# Patient Record
Sex: Female | Born: 1985 | Race: White | Hispanic: No | Marital: Single | Smoking: Current every day smoker
Health system: Southern US, Community
[De-identification: ages and names within clinical notes are randomized; demographics above are authoritative.]

## PROBLEM LIST (undated history)

## (undated) DIAGNOSIS — F319 Bipolar disorder, unspecified: Secondary | ICD-10-CM

## (undated) DIAGNOSIS — N809 Endometriosis, unspecified: Secondary | ICD-10-CM

## (undated) DIAGNOSIS — F410 Panic disorder [episodic paroxysmal anxiety] without agoraphobia: Secondary | ICD-10-CM

## (undated) DIAGNOSIS — F909 Attention-deficit hyperactivity disorder, unspecified type: Secondary | ICD-10-CM

## (undated) HISTORY — PX: LAPAROSCOPY: SHX197

---

## 2010-08-28 ENCOUNTER — Emergency Department (HOSPITAL_COMMUNITY): Admission: EM | Admit: 2010-08-28 | Discharge: 2010-08-28 | Payer: Self-pay | Admitting: Family Medicine

## 2011-01-15 LAB — POCT URINALYSIS DIPSTICK
Bilirubin Urine: NEGATIVE
Glucose, UA: NEGATIVE mg/dL
Ketones, ur: NEGATIVE mg/dL
Nitrite: NEGATIVE
Protein, ur: NEGATIVE mg/dL
Specific Gravity, Urine: 1.015 (ref 1.005–1.030)
Urobilinogen, UA: 0.2 mg/dL (ref 0.0–1.0)
pH: 5 (ref 5.0–8.0)

## 2011-01-15 LAB — POCT PREGNANCY, URINE: Preg Test, Ur: NEGATIVE

## 2014-03-17 ENCOUNTER — Encounter (HOSPITAL_COMMUNITY): Payer: Self-pay | Admitting: Emergency Medicine

## 2014-03-17 ENCOUNTER — Emergency Department (HOSPITAL_COMMUNITY)
Admission: EM | Admit: 2014-03-17 | Discharge: 2014-03-17 | Disposition: A | Discharge status: Home / Self Care | Payer: BC Managed Care – PPO | Source: Home / Self Care | Attending: Family Medicine | Admitting: Family Medicine

## 2014-03-17 DIAGNOSIS — N390 Urinary tract infection, site not specified: Secondary | ICD-10-CM

## 2014-03-17 HISTORY — DX: Bipolar disorder, unspecified: F31.9

## 2014-03-17 HISTORY — DX: Endometriosis, unspecified: N80.9

## 2014-03-17 HISTORY — DX: Attention-deficit hyperactivity disorder, unspecified type: F90.9

## 2014-03-17 HISTORY — DX: Panic disorder (episodic paroxysmal anxiety): F41.0

## 2014-03-17 LAB — POCT URINALYSIS DIP (DEVICE)
Bilirubin Urine: NEGATIVE
Glucose, UA: NEGATIVE mg/dL
Ketones, ur: NEGATIVE mg/dL
Nitrite: NEGATIVE
Protein, ur: NEGATIVE mg/dL
Specific Gravity, Urine: <=1.005 (ref 1.005–1.030)
Urobilinogen, UA: 0.2 mg/dL (ref 0.0–1.0)
pH: 5.5 (ref 5.0–8.0)

## 2014-03-17 LAB — POCT PREGNANCY, URINE: Preg Test, Ur: NEGATIVE

## 2014-03-17 MED ORDER — NITROFURANTOIN MONOHYD MACRO 100 MG PO CAPS: 100.0000 mg | ORAL_CAPSULE | Freq: Two times a day (BID) | ORAL | Status: AC

## 2014-03-17 NOTE — ED Notes (Signed)
"  possible bladder infection"

## 2014-03-17 NOTE — ED Provider Notes (Signed)
CSN: [id number]     Arrival date & time 03/17/14  1157 History   First MD Initiated Contact with Patient 03/17/14 1223     Chief Complaint  Patient presents with  . Urinary Tract Infection   (Consider location/radiation/quality/duration/timing/severity/associated sxs/prior Treatment) Patient is a 28 y.o. female presenting with urinary tract infection. The history is provided by the patient.  Urinary Tract Infection This is a new problem. The current episode started yesterday. The problem has been gradually worsening. Associated symptoms include abdominal pain.    Past Medical History  Diagnosis Date  . Endometriosis   . ADHD (attention deficit hyperactivity disorder)   . Panic disorder   . Bipolar 1 disorder    Past Surgical History  Procedure Laterality Date  . Laparoscopy     No family history on file. History  Substance Use Topics  . Smoking status: Current Every Day Smoker  . Smokeless tobacco: Not on file  . Alcohol Use: No   OB History   Grav Para Term Preterm Abortions TAB SAB Ect Mult Living                 Review of Systems  Constitutional: Negative.   Gastrointestinal: Positive for abdominal pain. Negative for nausea and vomiting.  Genitourinary: Positive for dysuria, urgency and frequency. Negative for vaginal bleeding and vaginal discharge.    Allergies  Hydrocodone and Percocet  Home Medications   Prior to Admission medications   Medication Sig Start Date End Date Taking? Authorizing Provider  ALPRAZolam Prudy Feeler(XANAX) 1 MG tablet Take 1 mg by mouth at bedtime as needed for anxiety.   Yes Historical Provider, MD  amphetamine-dextroamphetamine (ADDERALL) 30 MG tablet Take 30 mg by mouth daily.   Yes Historical Provider, MD  lamoTRIgine (LAMICTAL) 100 MG tablet Take 100 mg by mouth daily.   Yes Historical Provider, MD   BP 116/65  Pulse 93  Temp(Src) 98.6 F (37 C) (Oral)  Resp 20  SpO2 100% Physical Exam  Nursing note and vitals  reviewed. Constitutional: She is oriented to person, place, and time. She appears well-developed and well-nourished.  Abdominal: Soft. Normal appearance and bowel sounds are normal. She exhibits no distension and no mass. There is tenderness in the suprapubic area. There is no rigidity, no rebound, no guarding and no CVA tenderness.  Neurological: She is alert and oriented to person, place, and time.  Skin: Skin is warm and dry.    ED Course  Procedures (including critical care time) Labs Review Labs Reviewed  POCT URINALYSIS DIP (DEVICE) - Abnormal; Notable for the following:    Hgb urine dipstick LARGE (*)    Leukocytes, UA MODERATE (*)    All other components within normal limits  POCT PREGNANCY, URINE    Imaging Review No results found.   MDM   1. UTI (lower urinary tract infection)        Linna HoffJames D Kindl, MD 03/17/14 1250

## 2014-03-17 NOTE — Discharge Instructions (Signed)
Take all of medicine as directed, drink lots of fluids, see your doctor if further problems. °

## 2015-11-30 ENCOUNTER — Ambulatory Visit (INDEPENDENT_AMBULATORY_CARE_PROVIDER_SITE_OTHER): Payer: BLUE CROSS/BLUE SHIELD | Admitting: Psychiatry

## 2015-11-30 DIAGNOSIS — F4010 Social phobia, unspecified: Secondary | ICD-10-CM

## 2015-11-30 DIAGNOSIS — F332 Major depressive disorder, recurrent severe without psychotic features: Secondary | ICD-10-CM | POA: Diagnosis not present
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
## 2015-12-07 ENCOUNTER — Telehealth (HOSPITAL_COMMUNITY): Payer: Self-pay | Admitting: *Deleted

## 2015-12-07 ENCOUNTER — Institutional Professional Consult (permissible substitution): Payer: Self-pay | Admitting: Psychiatry

## 2015-12-07 NOTE — Telephone Encounter (Signed)
Called  [phone] spoke to [facility] for prior authorization of ECT treatment. Was told no authorization is required.

## 2016-01-09 ENCOUNTER — Emergency Department (HOSPITAL_COMMUNITY): Payer: BLUE CROSS/BLUE SHIELD

## 2016-01-09 ENCOUNTER — Emergency Department (HOSPITAL_COMMUNITY)
Admission: EM | Admit: 2016-01-09 | Discharge: 2016-01-09 | Disposition: A | Discharge status: Home / Self Care | Payer: BLUE CROSS/BLUE SHIELD | Attending: Emergency Medicine | Admitting: Emergency Medicine

## 2016-01-09 ENCOUNTER — Encounter (HOSPITAL_COMMUNITY): Payer: Self-pay | Admitting: *Deleted

## 2016-01-09 DIAGNOSIS — Z79899 Other long term (current) drug therapy: Secondary | ICD-10-CM | POA: Diagnosis not present

## 2016-01-09 DIAGNOSIS — F909 Attention-deficit hyperactivity disorder, unspecified type: Secondary | ICD-10-CM | POA: Diagnosis not present

## 2016-01-09 DIAGNOSIS — F17200 Nicotine dependence, unspecified, uncomplicated: Secondary | ICD-10-CM | POA: Diagnosis not present

## 2016-01-09 DIAGNOSIS — R479 Unspecified speech disturbances: Secondary | ICD-10-CM | POA: Diagnosis not present

## 2016-01-09 DIAGNOSIS — F319 Bipolar disorder, unspecified: Secondary | ICD-10-CM | POA: Insufficient documentation

## 2016-01-09 DIAGNOSIS — R42 Dizziness and giddiness: Secondary | ICD-10-CM | POA: Insufficient documentation

## 2016-01-09 DIAGNOSIS — F419 Anxiety disorder, unspecified: Secondary | ICD-10-CM | POA: Diagnosis not present

## 2016-01-09 DIAGNOSIS — Z8742 Personal history of other diseases of the female genital tract: Secondary | ICD-10-CM | POA: Diagnosis not present

## 2016-01-09 LAB — RAPID URINE DRUG SCREEN, HOSP PERFORMED
Amphetamines: NOT DETECTED
Barbiturates: NOT DETECTED
Benzodiazepines: POSITIVE — AB
Cocaine: NOT DETECTED
Opiates: NOT DETECTED
Tetrahydrocannabinol: NOT DETECTED

## 2016-01-09 LAB — ETHANOL: Alcohol, Ethyl (B): <5 mg/dL (ref <5)

## 2016-01-09 LAB — CBC
HCT: 38.8 % (ref 36.0–46.0)
Hemoglobin: 13.6 g/dL (ref 12.0–15.0)
MCH: 30.5 pg (ref 26.0–34.0)
MCHC: 35.1 g/dL (ref 30.0–36.0)
MCV: 87 fL (ref 78.0–100.0)
Platelets: 292 10*3/uL (ref 150–400)
RBC: 4.46 MIL/uL (ref 3.87–5.11)
RDW: 12.2 % (ref 11.5–15.5)
WBC: 11.4 10*3/uL — ABNORMAL HIGH (ref 4.0–10.5)

## 2016-01-09 LAB — URINALYSIS, ROUTINE W REFLEX MICROSCOPIC
Bilirubin Urine: NEGATIVE
Glucose, UA: NEGATIVE mg/dL
Ketones, ur: NEGATIVE mg/dL
Nitrite: NEGATIVE
Protein, ur: NEGATIVE mg/dL
Specific Gravity, Urine: 1.009 (ref 1.005–1.030)
pH: 7.5 (ref 5.0–8.0)

## 2016-01-09 LAB — BASIC METABOLIC PANEL
Anion gap: 9 (ref 5–15)
BUN: 11 mg/dL (ref 6–20)
CO2: 26 mmol/L (ref 22–32)
Calcium: 9.6 mg/dL (ref 8.9–10.3)
Chloride: 103 mmol/L (ref 101–111)
Creatinine, Ser: 0.87 mg/dL (ref 0.44–1.00)
GFR calc Af Amer: >60 mL/min (ref >60)
GFR calc non Af Amer: >60 mL/min (ref >60)
Glucose, Bld: 94 mg/dL (ref 65–99)
Potassium: 4.5 mmol/L (ref 3.5–5.1)
Sodium: 138 mmol/L (ref 135–145)

## 2016-01-09 LAB — URINE MICROSCOPIC-ADD ON: Bacteria, UA: NONE SEEN

## 2016-01-09 LAB — CBG MONITORING, ED: Glucose-Capillary: 90 mg/dL (ref 65–99)

## 2016-01-09 MED ORDER — SODIUM CHLORIDE 0.9 % IV BOLUS (SEPSIS): 1000.0000 mL | Freq: Once | INTRAVENOUS | Status: DC

## 2016-01-09 NOTE — ED Notes (Signed)
Pt remains monitored by blood pressure, pulse ox, and 5 lead. Pt is noted to have visitor at bedside.  

## 2016-01-09 NOTE — ED Notes (Signed)
Patient transported to CT 

## 2016-01-09 NOTE — Discharge Instructions (Signed)
Dizziness Dizziness is a common problem. It is a feeling of unsteadiness or light-headedness. You may feel like you are about to faint. Dizziness can lead to injury if you stumble or fall. Anyone can become dizzy, but dizziness is more common in older adults. This condition can be caused by a number of things, including medicines, dehydration, or illness. HOME CARE INSTRUCTIONS Taking these steps may help with your condition: Eating and Drinking  Drink enough fluid to keep your urine clear or pale yellow. This helps to keep you from becoming dehydrated. Try to drink more clear fluids, such as water.  Do not drink alcohol.  Limit your caffeine intake if directed by your health care provider.  Limit your salt intake if directed by your health care provider. Activity  Avoid making quick movements.  Rise slowly from chairs and steady yourself until you feel okay.  In the morning, first sit up on the side of the bed. When you feel okay, stand slowly while you hold onto something until you know that your balance is fine.  Move your legs often if you need to stand in one place for a long time. Tighten and relax your muscles in your legs while you are standing.  Do not drive or operate heavy machinery if you feel dizzy.  Avoid bending down if you feel dizzy. Place items in your home so that they are easy for you to reach without leaning over. Lifestyle  Do not use any tobacco products, including cigarettes, chewing tobacco, or electronic cigarettes. If you need help quitting, ask your health care provider.  Try to reduce your stress level, such as with yoga or meditation. Talk with your health care provider if you need help. General Instructions  Watch your dizziness for any changes.  Take medicines only as directed by your health care provider. Talk with your health care provider if you think that your dizziness is caused by a medicine that you are taking.  Tell a friend or a family  member that you are feeling dizzy. If he or she notices any changes in your behavior, have this person call your health care provider.  Keep all follow-up visits as directed by your health care provider. This is important. SEEK MEDICAL CARE IF:  Your dizziness does not go away.  Your dizziness or light-headedness gets worse.  You feel nauseous.  You have reduced hearing.  You have new symptoms.  You are unsteady on your feet or you feel like the room is spinning. SEEK IMMEDIATE MEDICAL CARE IF:  You vomit or have diarrhea and are unable to eat or drink anything.  You have problems talking, walking, swallowing, or using your arms, hands, or legs.  You feel generally weak.  You are not thinking clearly or you have trouble forming sentences. It may take a friend or family member to notice this.  You have chest pain, abdominal pain, shortness of breath, or sweating.  Your vision changes.  You notice any bleeding.  You have a headache.  You have neck pain or a stiff neck.  You have a fever.   This information is not intended to replace advice given to you by your health care provider. Make sure you discuss any questions you have with your health care provider.   Document Released: 04/15/2001 Document Revised: 03/06/2015 Document Reviewed: 10/16/2014 Elsevier Interactive Patient Education 2016 [employer] Generalized Anxiety Disorder Generalized anxiety disorder (GAD) is a mental disorder. It interferes with life functions, including relationships, work,  work, and school. °GAD is different from normal anxiety, which everyone experiences at some point in their lives in response to specific life events and activities. Normal anxiety actually helps us prepare for and get through these life events and activities. Normal anxiety goes away after the event or activity is over.  °GAD causes anxiety that is not necessarily related to specific events or activities. It also causes excess  anxiety in proportion to specific events or activities. The anxiety associated with GAD is also difficult to control. GAD can vary from mild to severe. People with severe GAD can have intense waves of anxiety with physical symptoms (panic attacks).  °SYMPTOMS °The anxiety and worry associated with GAD are difficult to control. This anxiety and worry are related to many life events and activities and also occur more days than not for 6 months or longer. People with GAD also have three or more of the following symptoms (one or more in children): °· Restlessness.   °· Fatigue. °· Difficulty concentrating.   °· Irritability. °· Muscle tension. °· Difficulty sleeping or unsatisfying sleep. °DIAGNOSIS °GAD is diagnosed through an assessment by your health care provider. Your health care provider will ask you questions about your mood, physical symptoms, and events in your life. Your health care provider may ask you about your medical history and use of alcohol or drugs, including prescription medicines. Your health care provider may also do a physical exam and blood tests. Certain medical conditions and the use of certain substances can cause symptoms similar to those associated with GAD. Your health care provider may refer you to a mental health specialist for further evaluation. °TREATMENT °The following therapies are usually used to treat GAD:  °· Medication. Antidepressant medication usually is prescribed for long-term daily control. Antianxiety medicines may be added in severe cases, especially when panic attacks occur.   °· Talk therapy (psychotherapy). Certain types of talk therapy can be helpful in treating GAD by providing support, education, and guidance. A form of talk therapy called cognitive behavioral therapy can teach you healthy ways to think about and react to daily life events and activities. °· Stress management techniques. These include yoga, meditation, and exercise and can be very helpful when they  are practiced regularly. °A mental health specialist can help determine which treatment is best for you. Some people see improvement with one therapy. However, other people require a combination of therapies. °  °This information is not intended to replace advice given to you by your health care provider. Make sure you discuss any questions you have with your health care provider. °  °Document Released: 02/14/2013 Document Revised: 11/10/2014 Document Reviewed: 02/14/2013 °Elsevier Interactive Patient Education ©2016 Elsevier Inc. ° °

## 2016-01-09 NOTE — ED Provider Notes (Signed)
CSN: [id number]     Arrival date & time 01/09/16  1003 History   First MD Initiated Contact with Patient 01/09/16 1342     Chief Complaint  Patient presents with  . Dizziness    Rachel Grimes is a 30 y.o. female with a history of bipolar disorder, ADHD and panic disorder who presents to the ED with her fiance complaining of feeling lightheaded and having trouble with her short-term memory. Patient reports around 7 AM this morning while at work she felt like she might pass out. She reports having some difficulty with her short-term memory and trouble finding words briefly. She reports that she has been repeating herself. She is holding a notebook full of her symptoms during the interview. She reports she is having trouble ambulating and feels off balance. She reports feeling anxious and that her mouth is dry. She has been eating and drinking normally today. She denies fevers, headache, double vision, syncope, neck pain, neck stiffness, back pain, abdominal pain, nausea, vomiting, diarrhea, chest pain, shortness of breath, coughing, numbness, tingling, weakness, loss of bladder control, loss of bowel control or urinary symptoms. No room spinning dizziness. No changes to her medications recently.   Patient is a 30 y.o. female presenting with dizziness. The history is provided by the patient and a significant other. No language interpreter was used.  Dizziness Associated symptoms: no chest pain, no diarrhea, no headaches, no nausea, no palpitations, no shortness of breath, no vomiting and no weakness     Past Medical History  Diagnosis Date  . Endometriosis   . ADHD (attention deficit hyperactivity disorder)   . Panic disorder   . Bipolar 1 disorder [facility])    Past Surgical History  Procedure Laterality Date  . Laparoscopy     No family history on file. Social History  Substance Use Topics  . Smoking status: Current Every Day Smoker  . Smokeless tobacco: None  . Alcohol Use: No   OB History     No data available     Review of Systems  Constitutional: Negative for fever and chills.  HENT: Negative for congestion, sore throat and trouble swallowing.   Eyes: Negative for pain and visual disturbance.  Respiratory: Negative for cough, shortness of breath and wheezing.   Cardiovascular: Negative for chest pain and palpitations.  Gastrointestinal: Negative for nausea, vomiting, abdominal pain and diarrhea.  Genitourinary: Negative for dysuria, urgency, frequency, hematuria and difficulty urinating.  Musculoskeletal: Negative for back pain and neck pain.  Skin: Negative for rash.  Neurological: Positive for speech difficulty and light-headedness. Negative for dizziness, seizures, syncope, facial asymmetry, weakness, numbness and headaches.      Allergies  Hydrocodone and Percocet  Home Medications   Prior to Admission medications   Medication Sig Start Date End Date Taking? Authorizing Provider  ALPRAZolam Prudy Feeler) 1 MG tablet Take 1 mg by mouth 3 (three) times daily as needed for anxiety.    Yes Historical Provider, MD  amphetamine-dextroamphetamine (ADDERALL) 30 MG tablet Take 30 mg by mouth 2 (two) times daily.    Yes Historical Provider, MD  aspirin-acetaminophen-caffeine (EXCEDRIN MIGRAINE) [phone] MG tablet Take 1 tablet by mouth every 6 (six) hours as needed for headache.   Yes Historical Provider, MD  ibuprofen (ADVIL,MOTRIN) 200 MG tablet Take 200 mg by mouth every 6 (six) hours as needed for moderate pain.   Yes Historical Provider, MD  lamoTRIgine (LAMICTAL) 200 MG tablet Take 400 mg by mouth daily.  01/04/16  Yes Historical Provider, MD  propranolol (INDERAL) 20 MG tablet Take 20 mg by mouth 2 (two) times daily. 10/06/15  Yes Historical Provider, MD  nitrofurantoin, macrocrystal-monohydrate, (MACROBID) 100 MG capsule Take 1 capsule (100 mg total) by mouth 2 (two) times daily. Patient not taking: Reported on 01/09/2016 03/17/14   Linna Hoff, MD   BP 102/66 mmHg   Pulse 64  Temp(Src) 97.4 F (36.3 C) (Oral)  Resp 18  Ht  (1.6 m)  Wt 49.125 kg  BMI 19.19 kg/m2  SpO2 100%  LMP 12/18/2015 (Exact Date) Physical Exam  Constitutional: She is oriented to person, place, and time. She appears well-developed and well-nourished. No distress.  Nontoxic appearing. Patient holding a notebook full of her symptoms. Patient speaking very quietly.  HENT:  Head: Normocephalic and atraumatic.  Right Ear: External ear normal.  Left Ear: External ear normal.  Mouth/Throat: Oropharynx is clear and moist. No oropharyngeal exudate.  Eyes: Conjunctivae and EOM are normal. Pupils are equal, round, and reactive to light. Right eye exhibits no discharge. Left eye exhibits no discharge.  Neck: Normal range of motion. Neck supple. No JVD present. No tracheal deviation present.  Cardiovascular: Normal rate, regular rhythm, normal heart sounds and intact distal pulses.  Exam reveals no gallop and no friction rub.   No murmur heard. Bilateral radial and posterior tibialis pulses are intact.  Pulmonary/Chest: Effort normal and breath sounds normal. No respiratory distress. She has no wheezes. She has no rales. She exhibits no tenderness.  Lungs are clear to auscultation bilaterally.  Abdominal: Soft. Bowel sounds are normal. She exhibits no distension. There is no tenderness. There is no guarding.  Abdomen is soft and nontender to palpation.  Musculoskeletal: Normal range of motion. She exhibits no edema or tenderness.  No lower extremity edema or tenderness. 5 out of 5 strength in her bilateral upper and lower extremities.  Lymphadenopathy:    She has no cervical adenopathy.  Neurological: She is alert and oriented to person, place, and time. No cranial nerve deficit. Coordination normal.  The patient is alert and oriented 3. Cranial nerves are intact. Sensation is intact to her bilateral upper and lower extremities. Speech is clear and coherent. Normal gait. No pronator  drift. Finger to nose intact bilaterally. EOMs intact. Vision is grossly intact.  Skin: Skin is warm and dry. No rash noted. She is not diaphoretic. No erythema. No pallor.  Psychiatric: Her speech is normal and behavior is normal. Her mood appears anxious.  Patient appears anxious. She is holding a notebook full of her symptoms. She is speaking in a very quiet voice.  Nursing note and vitals reviewed.   ED Course  Procedures (including critical care time) Labs Review Labs Reviewed  CBC - Abnormal; Notable for the following:    WBC 11.4 (*)    All other components within normal limits  URINALYSIS, ROUTINE W REFLEX MICROSCOPIC (NOT AT [facility]) - Abnormal; Notable for the following:    Hgb urine dipstick SMALL (*)    Leukocytes, UA SMALL (*)    All other components within normal limits  URINE MICROSCOPIC-ADD ON - Abnormal; Notable for the following:    Squamous Epithelial / LPF 0-5 (*)    All other components within normal limits  URINE RAPID DRUG SCREEN, HOSP PERFORMED - Abnormal; Notable for the following:    Benzodiazepines POSITIVE (*)    All other components within normal limits  BASIC METABOLIC PANEL  ETHANOL  CBG MONITORING, ED    Imaging Review Ct Head Wo  Contrast  01/09/2016  CLINICAL DATA:  Weakness and decreased vision this morning with improved symptomatology EXAM: CT HEAD WITHOUT CONTRAST TECHNIQUE: Contiguous axial images were obtained from the base of the skull through the vertex without intravenous contrast. COMPARISON:  None. FINDINGS: The bony calvarium is intact. The ventricles are of normal size and configuration. No findings to suggest acute hemorrhage, acute infarction or space-occupying mass lesion are noted. IMPRESSION: No acute intracranial abnormality noted. Electronically Signed   By: Alcide CleverMark  Lukens M.D.   On: 01/09/2016 15:00   I have personally reviewed and evaluated these images and lab results as part of my medical decision-making.   EKG  Interpretation   Date/Time:  Wednesday January 09 2016 10:29:07 EST Ventricular Rate:  74 PR Interval:  156 QRS Duration: 78 QT Interval:  380 QTC Calculation: 421 R Axis:   84 Text Interpretation:  Normal sinus rhythm Normal ECG Confirmed by  ZACKOWSKI  MD, SCOTT ([id number]) on 01/09/2016 3:27:52 PM      Filed Vitals:   01/09/16 1018 01/09/16 1246 01/09/16 1409 01/09/16 1522  BP: 101/58 104/68 115/75 102/66  Pulse: 76 68 74 64  Temp: 97.6 F (36.4 C)  97.4 F (36.3 C)   TempSrc: Oral  Oral   Resp: 18 16 22 18   Height: 5\' 3"  (1.6 m)     Weight: 49.125 kg     SpO2: 100% 100% 93% 100%     MDM   Meds given in ED:  Medications  sodium chloride 0.9 % bolus 1,000 mL (not administered)    New Prescriptions   No medications on file    Final diagnoses:  Dizziness  Anxiety   This  is a 30 y.o. female with a history of bipolar disorder, ADHD and panic disorder who presents to the ED with her fiance complaining of feeling lightheaded and having trouble with her short-term memory. Patient reports around 7 AM this morning while at work she felt like she might pass out. She reports having some difficulty with her short-term memory and trouble finding words briefly. She reports that she has been repeating herself. She is holding a notebook full of her symptoms during the interview. She reports she is having trouble ambulating and feels off balance. She reports feeling anxious and that her mouth is dry. She has been eating and drinking normally today.  On exam the patient is afebrile nontoxic appearing. She appears anxious. She is speaking in a soft voice. She is holding a notebook full of her symptoms today. She has no focal neurological deficits. She has normal gait. Speech is clear and coherent. Lungs are clear to auscultation bilaterally. Abdomen is soft and nontender to palpation. She denies any changes to her medications recently. Urinalysis shows small leukocytes and is nitrite negative.  Patient denies any urinary symptoms. Urine drug screen is positive for benzodiazepines which she is prescribed. BMP is within normal limits. CBC is unremarkable. Alcohol level is 0. CT scan of her head is also unremarkable. At reevaluation patient reports her symptoms have resolved. She reports she is feeling slightly sleepy. She does not feel lightheaded or dizzy with position change. Patient's work appears unremarkable. Will discharge with close follow-up by her primary care provider and her psychiatrist. I discussed strict and specific return precautions.  I advised the patient to follow-up with their primary care provider this week. I advised the patient to return to the emergency department with new or worsening symptoms or new concerns. The patient verbalized understanding and agreement  with plan.     Everlene Farrier, PA-C 01/09/16 1539  Vanetta Mulders, MD 01/10/16 [phone]

## 2016-01-09 NOTE — ED Notes (Signed)
Pt reports having dizziness, weakness, nausea, confusion and decreased vision. Pt reports that symptoms started around 7am. No neuro deficits noted in triage.

## 2016-01-09 NOTE — ED Notes (Signed)
Pt reports that she does not want IV fluids at this time. PA notified.

## 2016-06-04 IMAGING — CT CT HEAD W/O CM
2 series · 15 of 30 positions shown, 17 images · non-contrast
Comparison: None.

CLINICAL DATA: Weakness and decreased vision this morning with
improved symptomatology

EXAM:
CT HEAD WITHOUT CONTRAST
TECHNIQUE: Contiguous axial images were obtained from the base of the skull
through the vertex without intravenous contrast.

[Series 2: head without · axial · non-contrast · 0.46mm/px · z∈[-49,+71]mm · 7 of 32 slices shown, 9 images]
[im 4/32  brain]
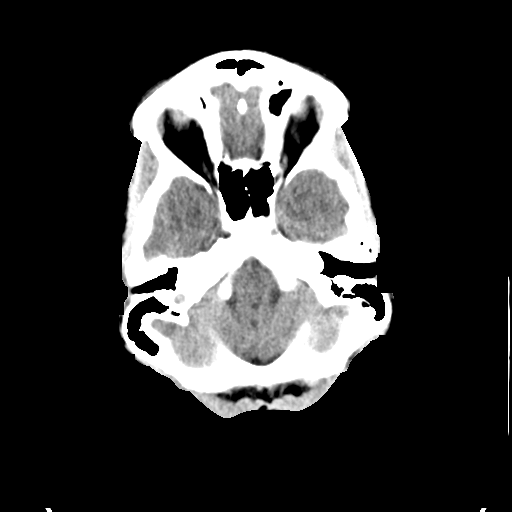
[im 4/32  bone]
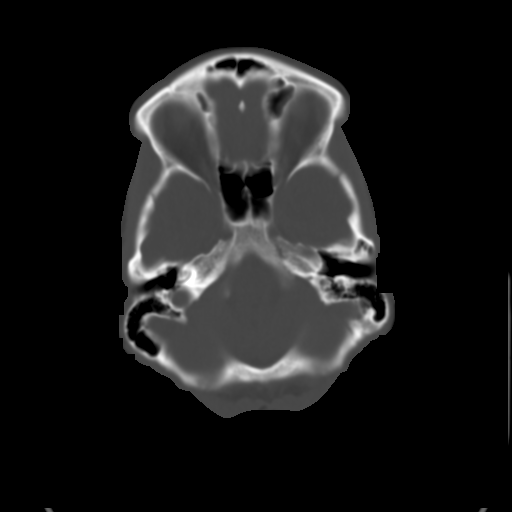
[im 8/32  brain]
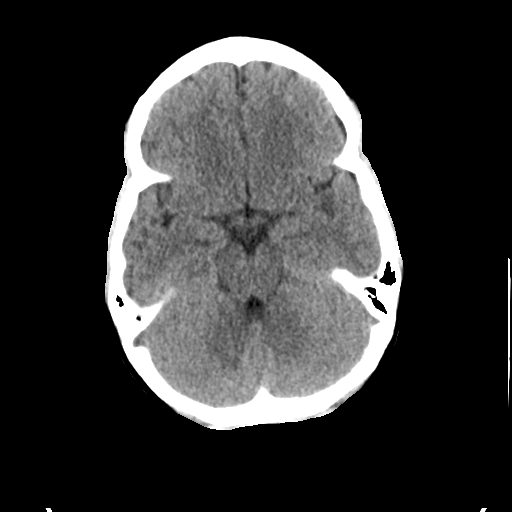
[im 12/32  brain]
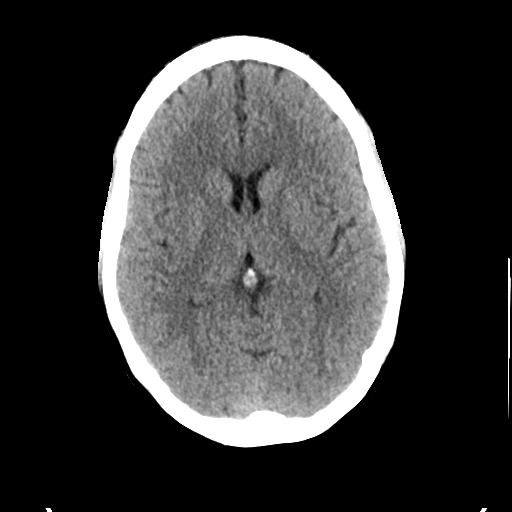
[im 16/32  brain]
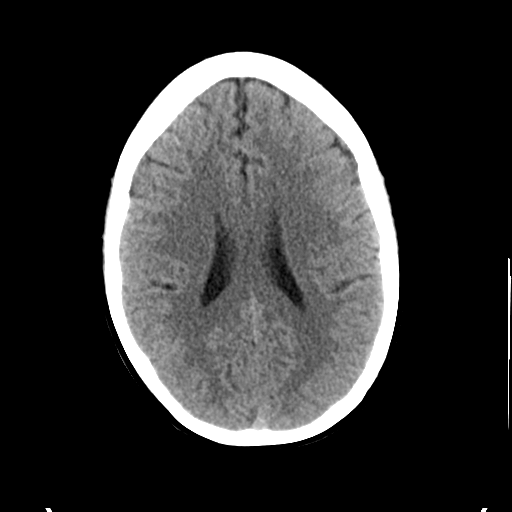
[im 20/32  brain]
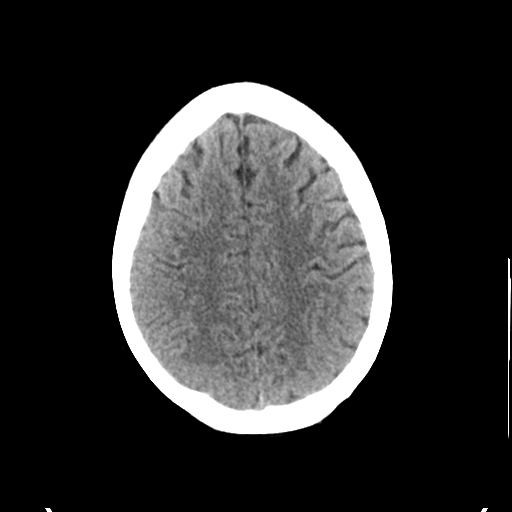
[im 20/32  bone]
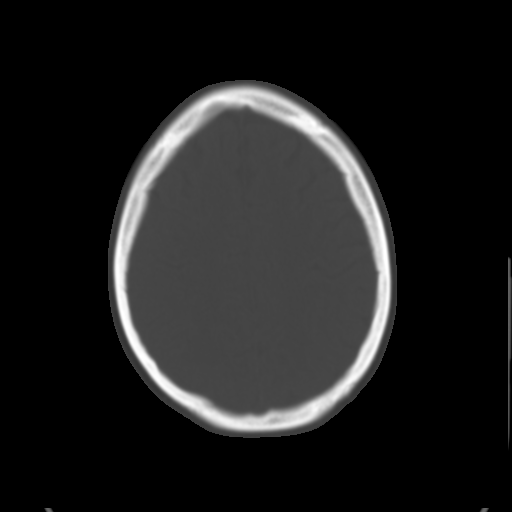
[im 24/32  brain]
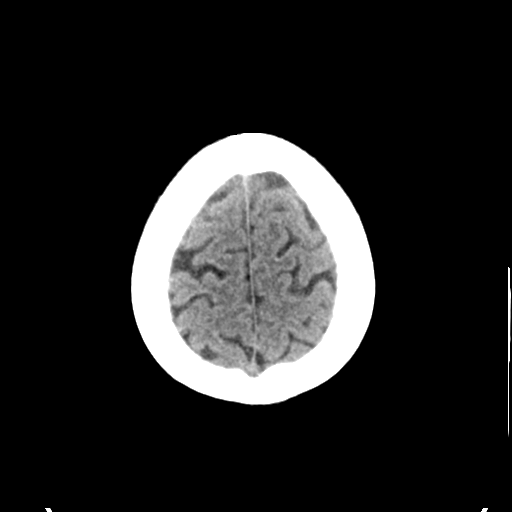
[im 28/32  brain]
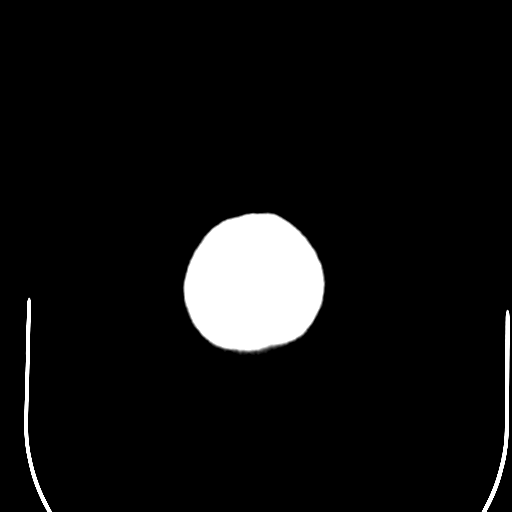

[Series 3: head bone · axial · 0.46mm/px · z∈[-50,+76]mm · 8 of 79 slices shown]
[im 8/79  bone]
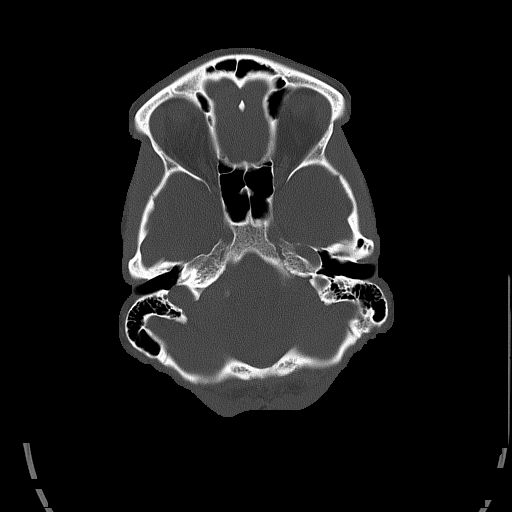
[im 16/79  bone]
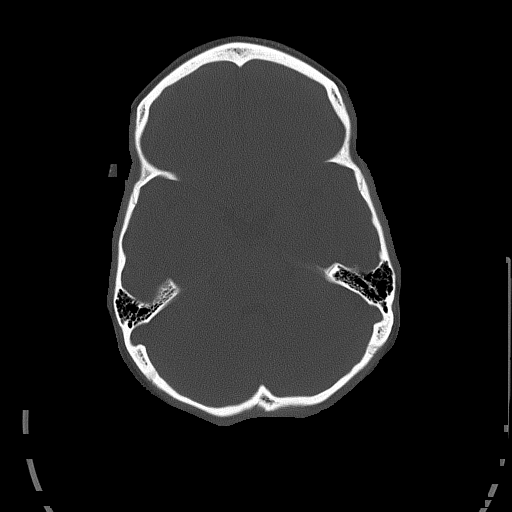
[im 24/79  bone]
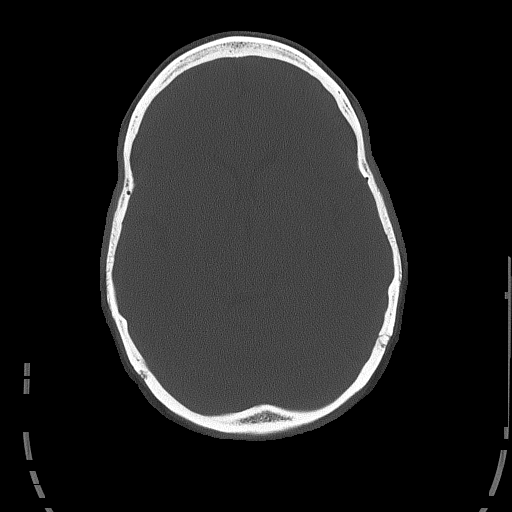
[im 36/79  bone]
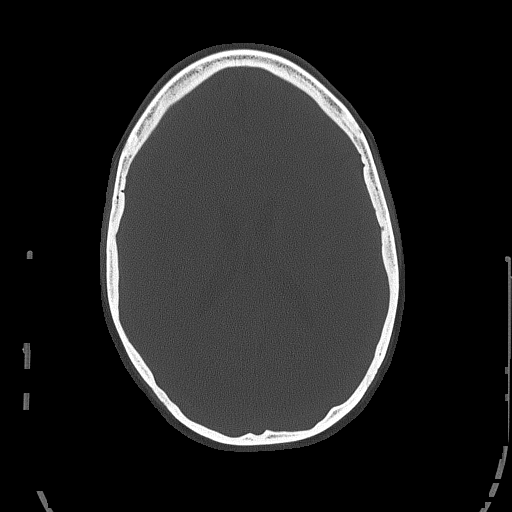
[im 43/79  bone]
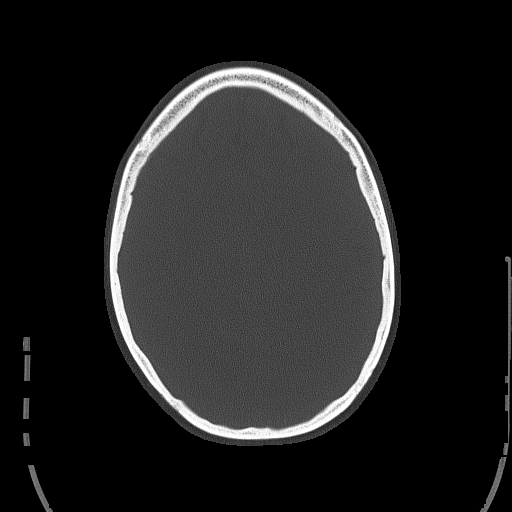
[im 55/79  bone]
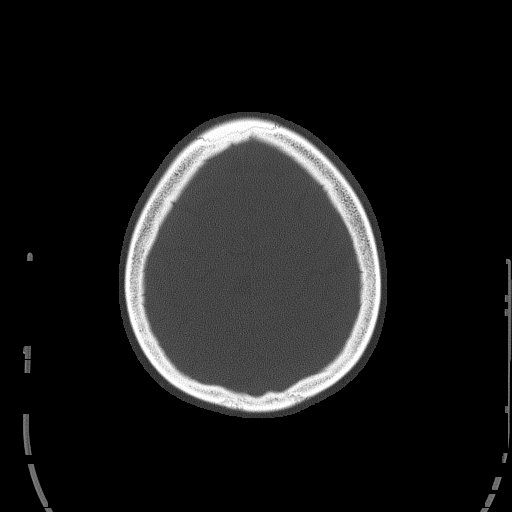
[im 63/79  bone]
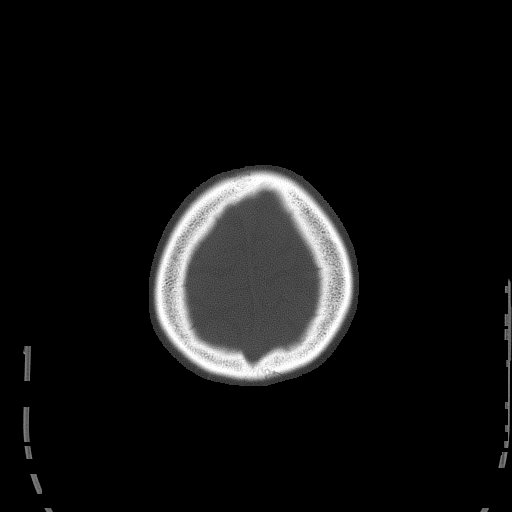
[im 71/79  bone]
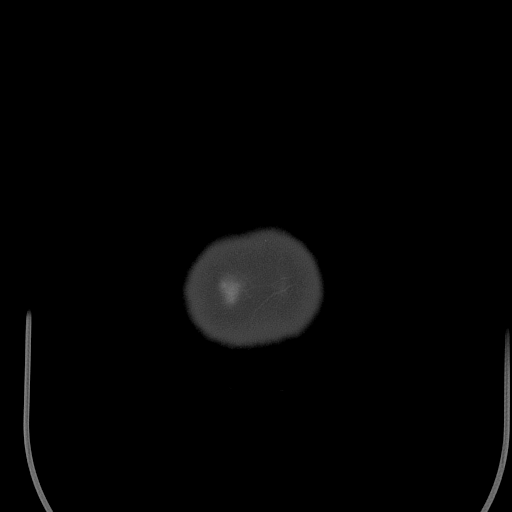

[15 of 30 positions shown; findings below may reference images not displayed]

FINDINGS: The bony calvarium is intact. The ventricles are of normal size and
configuration. No findings to suggest acute hemorrhage, acute
infarction or space-occupying mass lesion are noted.
IMPRESSION: No acute intracranial abnormality noted.
